# Patient Record
Sex: Male | Born: 1965 | Race: White | Hispanic: No | Marital: Married | Smoking: Former smoker
Health system: Southern US, Community
[De-identification: ages and names within clinical notes are randomized; demographics above are authoritative.]

---

## 2017-02-22 ENCOUNTER — Encounter (HOSPITAL_COMMUNITY): Payer: Self-pay

## 2017-02-22 ENCOUNTER — Emergency Department (HOSPITAL_COMMUNITY): Payer: BLUE CROSS/BLUE SHIELD

## 2017-02-22 ENCOUNTER — Inpatient Hospital Stay (HOSPITAL_COMMUNITY)
Admission: EM | Admit: 2017-02-22 | Discharge: 2017-02-27 | DRG: 084 | Disposition: A | Discharge status: Home / Self Care | Payer: BLUE CROSS/BLUE SHIELD | Attending: General Surgery | Admitting: General Surgery

## 2017-02-22 DIAGNOSIS — S066X9A Traumatic subarachnoid hemorrhage with loss of consciousness of unspecified duration, initial encounter: Principal | ICD-10-CM | POA: Diagnosis present

## 2017-02-22 DIAGNOSIS — G9389 Other specified disorders of brain: Secondary | ICD-10-CM | POA: Diagnosis present

## 2017-02-22 DIAGNOSIS — R4182 Altered mental status, unspecified: Secondary | ICD-10-CM | POA: Diagnosis not present

## 2017-02-22 DIAGNOSIS — S0181XA Laceration without foreign body of other part of head, initial encounter: Secondary | ICD-10-CM | POA: Diagnosis present

## 2017-02-22 DIAGNOSIS — S0219XA Other fracture of base of skull, initial encounter for closed fracture: Secondary | ICD-10-CM | POA: Diagnosis present

## 2017-02-22 DIAGNOSIS — S020XXA Fracture of vault of skull, initial encounter for closed fracture: Secondary | ICD-10-CM | POA: Diagnosis present

## 2017-02-22 DIAGNOSIS — I609 Nontraumatic subarachnoid hemorrhage, unspecified: Secondary | ICD-10-CM

## 2017-02-22 DIAGNOSIS — I10 Essential (primary) hypertension: Secondary | ICD-10-CM | POA: Diagnosis present

## 2017-02-22 DIAGNOSIS — S0101XA Laceration without foreign body of scalp, initial encounter: Secondary | ICD-10-CM | POA: Diagnosis present

## 2017-02-22 DIAGNOSIS — Z8572 Personal history of non-Hodgkin lymphomas: Secondary | ICD-10-CM

## 2017-02-22 DIAGNOSIS — T1490XA Injury, unspecified, initial encounter: Secondary | ICD-10-CM

## 2017-02-22 LAB — CDS SEROLOGY

## 2017-02-22 LAB — I-STAT CHEM 8, ED
BUN: 15 mg/dL (ref 6–20)
CALCIUM ION: 1 mmol/L — AB (ref 1.15–1.40)
Chloride: 103 mmol/L (ref 101–111)
Creatinine, Ser: 1.2 mg/dL (ref 0.61–1.24)
GLUCOSE: 99 mg/dL (ref 65–99)
HCT: 46 % (ref 39.0–52.0)
HEMOGLOBIN: 15.6 g/dL (ref 13.0–17.0)
Potassium: 3.6 mmol/L (ref 3.5–5.1)
SODIUM: 141 mmol/L (ref 135–145)
TCO2: 24 mmol/L (ref 22–32)

## 2017-02-22 LAB — COMPREHENSIVE METABOLIC PANEL
ALT: 56 U/L (ref 17–63)
ANION GAP: 12 (ref 5–15)
AST: 70 U/L — ABNORMAL HIGH (ref 15–41)
Albumin: 4 g/dL (ref 3.5–5.0)
Alkaline Phosphatase: 122 U/L (ref 38–126)
BUN: 14 mg/dL (ref 6–20)
CHLORIDE: 104 mmol/L (ref 101–111)
CO2: 21 mmol/L — ABNORMAL LOW (ref 22–32)
Calcium: 8.5 mg/dL — ABNORMAL LOW (ref 8.9–10.3)
Creatinine, Ser: 1.08 mg/dL (ref 0.61–1.24)
Glucose, Bld: 92 mg/dL (ref 65–99)
POTASSIUM: 3.9 mmol/L (ref 3.5–5.1)
SODIUM: 137 mmol/L (ref 135–145)
Total Bilirubin: 0.8 mg/dL (ref 0.3–1.2)
Total Protein: 6.9 g/dL (ref 6.5–8.1)

## 2017-02-22 LAB — CBC
HEMATOCRIT: 44.9 % (ref 39.0–52.0)
HEMOGLOBIN: 15.4 g/dL (ref 13.0–17.0)
MCH: 29.2 pg (ref 26.0–34.0)
MCHC: 34.3 g/dL (ref 30.0–36.0)
MCV: 85 fL (ref 78.0–100.0)
PLATELETS: 221 10*3/uL (ref 150–400)
RBC: 5.28 MIL/uL (ref 4.22–5.81)
RDW: 13.8 % (ref 11.5–15.5)
WBC: 6.8 10*3/uL (ref 4.0–10.5)

## 2017-02-22 LAB — URINALYSIS, ROUTINE W REFLEX MICROSCOPIC
BILIRUBIN URINE: NEGATIVE
Glucose, UA: NEGATIVE mg/dL
Ketones, ur: NEGATIVE mg/dL
LEUKOCYTES UA: NEGATIVE
NITRITE: NEGATIVE
Protein, ur: 100 mg/dL — AB
SPECIFIC GRAVITY, URINE: 1.028 (ref 1.005–1.030)
Squamous Epithelial / LPF: NONE SEEN
pH: 6 (ref 5.0–8.0)

## 2017-02-22 LAB — I-STAT CG4 LACTIC ACID, ED: LACTIC ACID, VENOUS: 2.97 mmol/L — AB (ref 0.5–1.9)

## 2017-02-22 LAB — I-STAT TROPONIN, ED: Troponin i, poc: 0.03 ng/mL (ref 0.00–0.08)

## 2017-02-22 LAB — PROTIME-INR
INR: 0.97
Prothrombin Time: 12.8 seconds (ref 11.4–15.2)

## 2017-02-22 LAB — ETHANOL: Alcohol, Ethyl (B): 60 mg/dL — ABNORMAL HIGH (ref <10)

## 2017-02-22 LAB — SAMPLE TO BLOOD BANK

## 2017-02-22 MED ORDER — MORPHINE SULFATE (PF) 4 MG/ML IV SOLN: 2.0000 mg | INTRAVENOUS | Status: DC | PRN

## 2017-02-22 MED ORDER — MORPHINE SULFATE (PF) 4 MG/ML IV SOLN
4.0000 mg | Freq: Once | INTRAVENOUS | Status: AC
  Administered 2017-02-22: 4 mg via INTRAVENOUS
  Filled 2017-02-22: qty 1

## 2017-02-22 MED ORDER — ONDANSETRON HCL 4 MG/2ML IJ SOLN
4.0000 mg | Freq: Four times a day (QID) | INTRAMUSCULAR | Status: DC | PRN
  Administered 2017-02-22 – 2017-02-27 (×8): 4 mg via INTRAVENOUS
  Filled 2017-02-22 (×8): qty 2

## 2017-02-22 MED ORDER — MORPHINE SULFATE (PF) 4 MG/ML IV SOLN
2.0000 mg | INTRAVENOUS | Status: DC | PRN
  Administered 2017-02-22 – 2017-02-24 (×5): 4 mg via INTRAVENOUS
  Filled 2017-02-22 (×5): qty 1

## 2017-02-22 MED ORDER — IOPAMIDOL (ISOVUE-370) INJECTION 76%
INTRAVENOUS | Status: AC
Start: 2017-02-22 — End: 2017-02-23
  Administered 2017-02-23: 50 mL
  Filled 2017-02-22: qty 50

## 2017-02-22 MED ORDER — BISACODYL 10 MG RE SUPP: 10.0000 mg | Freq: Every day | RECTAL | Status: DC | PRN

## 2017-02-22 MED ORDER — IOPAMIDOL (ISOVUE-300) INJECTION 61%
INTRAVENOUS | Status: AC
  Administered 2017-02-22: 100 mL
  Filled 2017-02-22: qty 100

## 2017-02-22 MED ORDER — TETANUS-DIPHTH-ACELL PERTUSSIS 5-2.5-18.5 LF-MCG/0.5 IM SUSP
0.5000 mL | Freq: Once | INTRAMUSCULAR | Status: AC
Start: 2017-02-22 — End: 2017-02-22
  Administered 2017-02-22: 0.5 mL via INTRAMUSCULAR
  Filled 2017-02-22: qty 0.5

## 2017-02-22 MED ORDER — PANTOPRAZOLE SODIUM 40 MG PO TBEC
40.0000 mg | DELAYED_RELEASE_TABLET | Freq: Every day | ORAL | Status: DC
  Administered 2017-02-23 – 2017-02-26 (×3): 40 mg via ORAL
  Filled 2017-02-22 (×3): qty 1

## 2017-02-22 MED ORDER — VANCOMYCIN HCL 10 G IV SOLR
2000.0000 mg | Freq: Once | INTRAVENOUS | Status: AC
  Administered 2017-02-23: 2000 mg via INTRAVENOUS
  Filled 2017-02-22: qty 2000

## 2017-02-22 MED ORDER — PANTOPRAZOLE SODIUM 40 MG IV SOLR
40.0000 mg | Freq: Every day | INTRAVENOUS | Status: DC
  Administered 2017-02-25 – 2017-02-27 (×2): 40 mg via INTRAVENOUS
  Filled 2017-02-22 (×2): qty 40

## 2017-02-22 MED ORDER — ACETAMINOPHEN 500 MG PO TABS
1000.0000 mg | ORAL_TABLET | Freq: Three times a day (TID) | ORAL | Status: DC
  Administered 2017-02-23 – 2017-02-27 (×14): 1000 mg via ORAL
  Filled 2017-02-22 (×14): qty 2

## 2017-02-22 MED ORDER — OXYCODONE HCL 5 MG PO TABS
5.0000 mg | ORAL_TABLET | ORAL | Status: DC | PRN
  Administered 2017-02-23 – 2017-02-24 (×3): 5 mg via ORAL
  Filled 2017-02-22 (×4): qty 1

## 2017-02-22 MED ORDER — LIDOCAINE HCL (PF) 1 % IJ SOLN
5.0000 mL | Freq: Once | INTRAMUSCULAR | Status: AC
  Administered 2017-02-22: 5 mL via INTRADERMAL
  Filled 2017-02-22: qty 5

## 2017-02-22 MED ORDER — SODIUM CHLORIDE 0.9 % IV SOLN
INTRAVENOUS | Status: DC
  Administered 2017-02-23: 1000 mL via INTRAVENOUS

## 2017-02-22 MED ORDER — PROCHLORPERAZINE MALEATE 10 MG PO TABS
10.0000 mg | ORAL_TABLET | Freq: Four times a day (QID) | ORAL | Status: DC | PRN
  Administered 2017-02-25: 10 mg via ORAL
  Filled 2017-02-22 (×2): qty 1

## 2017-02-22 MED ORDER — ONDANSETRON 4 MG PO TBDP
4.0000 mg | ORAL_TABLET | Freq: Four times a day (QID) | ORAL | Status: DC | PRN
  Administered 2017-02-23 – 2017-02-24 (×2): 4 mg via ORAL
  Filled 2017-02-22 (×2): qty 1

## 2017-02-22 MED ORDER — PROCHLORPERAZINE EDISYLATE 5 MG/ML IJ SOLN
5.0000 mg | Freq: Four times a day (QID) | INTRAMUSCULAR | Status: DC | PRN
  Administered 2017-02-23: 5 mg via INTRAVENOUS
  Administered 2017-02-24: 10 mg via INTRAVENOUS
  Filled 2017-02-22 (×2): qty 2

## 2017-02-22 MED ORDER — LACTATED RINGERS IV BOLUS (SEPSIS)
1000.0000 mL | Freq: Once | INTRAVENOUS | Status: AC
  Administered 2017-02-22: 1000 mL via INTRAVENOUS

## 2017-02-22 MED ORDER — FENTANYL CITRATE (PF) 100 MCG/2ML IJ SOLN
50.0000 ug | Freq: Once | INTRAMUSCULAR | Status: AC
Start: 2017-02-22 — End: 2017-02-22
  Administered 2017-02-22: 50 ug via INTRAVENOUS
  Filled 2017-02-22: qty 2

## 2017-02-22 MED ORDER — GENTAMICIN SULFATE 40 MG/ML IJ SOLN
160.0000 mg | Freq: Once | INTRAMUSCULAR | Status: AC
  Administered 2017-02-23: 160 mg via INTRAVENOUS
  Filled 2017-02-22: qty 4
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
## 2017-02-23 ENCOUNTER — Observation Stay (HOSPITAL_COMMUNITY): Payer: BLUE CROSS/BLUE SHIELD

## 2017-02-23 ENCOUNTER — Other Ambulatory Visit: Payer: Self-pay

## 2017-02-23 ENCOUNTER — Encounter (HOSPITAL_COMMUNITY): Payer: Self-pay

## 2017-02-23 DIAGNOSIS — S0219XA Other fracture of base of skull, initial encounter for closed fracture: Secondary | ICD-10-CM | POA: Diagnosis present

## 2017-02-23 DIAGNOSIS — R4182 Altered mental status, unspecified: Secondary | ICD-10-CM | POA: Diagnosis present

## 2017-02-23 DIAGNOSIS — Z8572 Personal history of non-Hodgkin lymphomas: Secondary | ICD-10-CM | POA: Diagnosis not present

## 2017-02-23 DIAGNOSIS — S0181XA Laceration without foreign body of other part of head, initial encounter: Secondary | ICD-10-CM | POA: Diagnosis present

## 2017-02-23 DIAGNOSIS — S020XXA Fracture of vault of skull, initial encounter for closed fracture: Secondary | ICD-10-CM | POA: Diagnosis present

## 2017-02-23 DIAGNOSIS — S066X9A Traumatic subarachnoid hemorrhage with loss of consciousness of unspecified duration, initial encounter: Secondary | ICD-10-CM | POA: Diagnosis present

## 2017-02-23 DIAGNOSIS — I10 Essential (primary) hypertension: Secondary | ICD-10-CM | POA: Diagnosis present

## 2017-02-23 DIAGNOSIS — G9389 Other specified disorders of brain: Secondary | ICD-10-CM | POA: Diagnosis present

## 2017-02-23 DIAGNOSIS — S0101XA Laceration without foreign body of scalp, initial encounter: Secondary | ICD-10-CM | POA: Diagnosis present

## 2017-02-23 LAB — BASIC METABOLIC PANEL
Anion gap: 12 (ref 5–15)
BUN: 12 mg/dL (ref 6–20)
CHLORIDE: 102 mmol/L (ref 101–111)
CO2: 21 mmol/L — ABNORMAL LOW (ref 22–32)
Calcium: 8.6 mg/dL — ABNORMAL LOW (ref 8.9–10.3)
Creatinine, Ser: 1 mg/dL (ref 0.61–1.24)
GFR calc non Af Amer: >60 mL/min (ref >60)
Glucose, Bld: 130 mg/dL — ABNORMAL HIGH (ref 65–99)
POTASSIUM: 4.6 mmol/L (ref 3.5–5.1)
SODIUM: 135 mmol/L (ref 135–145)

## 2017-02-23 LAB — CBC
HEMATOCRIT: 46.6 % (ref 39.0–52.0)
Hemoglobin: 15.9 g/dL (ref 13.0–17.0)
MCH: 29 pg (ref 26.0–34.0)
MCHC: 34.1 g/dL (ref 30.0–36.0)
MCV: 85 fL (ref 78.0–100.0)
Platelets: 188 10*3/uL (ref 150–400)
RBC: 5.48 MIL/uL (ref 4.22–5.81)
RDW: 13.9 % (ref 11.5–15.5)
WBC: 13.3 10*3/uL — AB (ref 4.0–10.5)

## 2017-02-23 LAB — HIV ANTIBODY (ROUTINE TESTING W REFLEX): HIV Screen 4th Generation wRfx: NONREACTIVE

## 2017-02-23 MED ORDER — ASPIRIN EC 81 MG PO TBEC
81.0000 mg | DELAYED_RELEASE_TABLET | Freq: Every day | ORAL | Status: DC
  Administered 2017-02-23 – 2017-02-27 (×5): 81 mg via ORAL
  Filled 2017-02-23 (×5): qty 1

## 2017-02-23 MED ORDER — LISINOPRIL 10 MG PO TABS
10.0000 mg | ORAL_TABLET | Freq: Every day | ORAL | Status: DC
  Administered 2017-02-23 – 2017-02-27 (×5): 10 mg via ORAL
  Filled 2017-02-23 (×5): qty 1
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
## 2017-02-23 NOTE — ED Notes (Signed)
Attempted to call report x 1  
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
## 2017-02-23 NOTE — ED Notes (Signed)
Patient transported to CT 

## 2017-02-23 NOTE — Progress Notes (Signed)
Patient ID: Elijah ChimeraCharles Baird, male   DOB: 1965/10/17, 51 y.o.   MRN: [id number] Patient awake alert complaining of a headache and some nausea.  Oriented 3 pupils are equal extraocular movements are intact strength 5 out of 5 upper and lower extremities  Follow-up CT as had not been done yet this morning but CT image her last night did show grade 1 blunt compression to ICA clinoid. We'll start enteric-coated aspirin. Continue mobilizes per trauma continue c-collar for now

## 2017-02-23 NOTE — ED Notes (Signed)
Neurosurgery rounding at bedside 
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
## 2017-02-24 ENCOUNTER — Encounter (HOSPITAL_COMMUNITY): Payer: Self-pay

## 2017-02-24 LAB — BASIC METABOLIC PANEL
Anion gap: 9 (ref 5–15)
BUN: 14 mg/dL (ref 6–20)
CHLORIDE: 102 mmol/L (ref 101–111)
CO2: 26 mmol/L (ref 22–32)
CREATININE: 0.98 mg/dL (ref 0.61–1.24)
Calcium: 8.9 mg/dL (ref 8.9–10.3)
GFR calc non Af Amer: >60 mL/min (ref >60)
Glucose, Bld: 118 mg/dL — ABNORMAL HIGH (ref 65–99)
Potassium: 3.7 mmol/L (ref 3.5–5.1)
Sodium: 137 mmol/L (ref 135–145)

## 2017-02-24 LAB — CBC
HEMATOCRIT: 41.6 % (ref 39.0–52.0)
HEMOGLOBIN: 14.4 g/dL (ref 13.0–17.0)
MCH: 29.3 pg (ref 26.0–34.0)
MCHC: 34.6 g/dL (ref 30.0–36.0)
MCV: 84.7 fL (ref 78.0–100.0)
Platelets: 192 10*3/uL (ref 150–400)
RBC: 4.91 MIL/uL (ref 4.22–5.81)
RDW: 14.1 % (ref 11.5–15.5)
WBC: 8.8 10*3/uL (ref 4.0–10.5)

## 2017-02-24 MED ORDER — INFLUENZA VAC SPLIT QUAD 0.5 ML IM SUSY: 0.5000 mL | PREFILLED_SYRINGE | INTRAMUSCULAR | Status: DC

## 2017-02-24 MED ORDER — OXYCODONE HCL 5 MG PO TABS
5.0000 mg | ORAL_TABLET | ORAL | Status: DC | PRN
  Administered 2017-02-25 – 2017-02-26 (×3): 10 mg via ORAL
  Administered 2017-02-27: 5 mg via ORAL
  Filled 2017-02-24 (×2): qty 2
  Filled 2017-02-24: qty 1
  Filled 2017-02-24 (×2): qty 2

## 2017-02-24 MED ORDER — MORPHINE SULFATE (PF) 4 MG/ML IV SOLN
2.0000 mg | INTRAVENOUS | Status: DC | PRN
  Administered 2017-02-24 – 2017-02-25 (×5): 4 mg via INTRAVENOUS
  Administered 2017-02-25: 2 mg via INTRAVENOUS
  Administered 2017-02-25 (×2): 4 mg via INTRAVENOUS
  Administered 2017-02-26 (×2): 2 mg via INTRAVENOUS
  Filled 2017-02-24 (×9): qty 1

## 2017-02-24 NOTE — Progress Notes (Signed)
Patient ID: Elijah ChimeraCharles Guynes, male   DOB: 1965/05/30, 51 y.o.   MRN: [id number] Patient well minimal headache  Awake alert oriented strength 5 out of 5 neurologically nonfocal  Mobilize per trauma discharge when medically stable per trauma no new neurosurgical recommendations

## 2017-02-24 NOTE — Progress Notes (Signed)
2000: Handoff report received from RN. Pt resting comfortably, describing pain as "manageable." Plan of care for the night discussed. Pt amenable to plan.  0000: Pt continues resting comfortably.  0400: Pt continues resting comfortably.  0700: Handoff reports given to RN. No acute events overnight.
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
## 2017-02-25 NOTE — Progress Notes (Signed)
Spoke to patient about possibly delaying his discharge another day so that we can monitor his mobility and the dizziness that comes with ambulation. Patient agreed, says he does not think that he can tolerate the 30 minute car ride home as of now.
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
## 2017-02-26 MED ORDER — TRAMADOL HCL 50 MG PO TABS
50.0000 mg | ORAL_TABLET | Freq: Four times a day (QID) | ORAL | Status: DC
  Administered 2017-02-26 – 2017-02-27 (×5): 50 mg via ORAL
  Filled 2017-02-26 (×5): qty 1

## 2017-02-26 MED ORDER — MORPHINE SULFATE (PF) 4 MG/ML IV SOLN: 2.0000 mg | INTRAVENOUS | Status: DC | PRN

## 2017-02-26 MED ORDER — ENOXAPARIN SODIUM 40 MG/0.4ML ~~LOC~~ SOLN
40.0000 mg | SUBCUTANEOUS | Status: DC
  Administered 2017-02-26: 40 mg via SUBCUTANEOUS
  Filled 2017-02-26: qty 0.4
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
## 2017-02-27 MED ORDER — TRAMADOL HCL 50 MG PO TABS: 50.0000 mg | ORAL_TABLET | Freq: Four times a day (QID) | ORAL | 0 refills | Status: AC

## 2017-02-27 MED ORDER — ACETAMINOPHEN 500 MG PO TABS: 1000.0000 mg | ORAL_TABLET | Freq: Three times a day (TID) | ORAL | 0 refills | Status: AC

## 2017-02-27 MED ORDER — LISINOPRIL 10 MG PO TABS: 10.0000 mg | ORAL_TABLET | Freq: Every day | ORAL | 1 refills | Status: AC

## 2017-02-27 MED ORDER — ASPIRIN 81 MG PO TBEC: 81.0000 mg | DELAYED_RELEASE_TABLET | Freq: Every day | ORAL | 1 refills | Status: AC

## 2017-02-27 MED ORDER — PROMETHAZINE HCL 25 MG PO TABS: 25.0000 mg | ORAL_TABLET | Freq: Four times a day (QID) | ORAL | 0 refills | Status: AC | PRN

## 2017-02-27 MED ORDER — OXYCODONE HCL 5 MG PO TABS: 5.0000 mg | ORAL_TABLET | ORAL | 0 refills | Status: AC | PRN

## 2017-02-27 MED ORDER — PROMETHAZINE HCL 25 MG PO TABS: 25.0000 mg | ORAL_TABLET | Freq: Four times a day (QID) | ORAL | Status: DC | PRN

## 2017-02-27 NOTE — Discharge Instructions (Signed)
Traumatic Brain Injury °Traumatic brain injury (TBI) is an injury to your brain from a blow to your head (closed injury) or an object penetrating your skull and entering your brain (open injury). The severity of TBI varies significantly from one person to the next. Some TBIs cause you to pass out (lose consciousness) immediately and for a long period of time. Other TBIs do not cause any loss of consciousness. °Symptoms of any type of TBI can be long lasting (chronic). TBI can interfere with memory and speech. TBI can also cause chronic symptoms like headache or dizziness. °What are the causes? °TBI is caused by a closed or open injury. °What increases the risk? °You may be at higher risk for TBI if you: °· Are 75 or older. °· Are a man. °· Are in a car accident. °· Play a contact sport, especially football, hockey, or soccer. °· Do not wear protective gear while playing sports. °· Are in the military. °· Are a victim of violence. °· Abuse drugs or alcohol. °· Have had a previous TBI. ° °What are the signs or symptoms? °Signs and symptoms of TBI may occur right away or not until days, weeks, or months after the injury. They may last for days, weeks, months, or years. Symptoms may include: °· Loss of consciousness. °· Headache. °· Confusion. °· Fatigue. °· Changes in sleep. °· Dizziness. °· Mood or personality changes. °· Memory problems. °· Nausea or vomiting or both. °· Seizures. °· Clumsiness. °· Slurred speech. °· Depression and anxiety. °· Anger. °· Inability to control emotions or actions (impulse control). °· Loss of or dulling of your senses, such as hearing, vision, and touch. This can include: °? Blurred vision. °? Ringing in your ears. ° °How is this diagnosed? °TBI may be diagnosed by a medical history and physical exam. Your health care provider will also do a neurologic exam to check your: °· Reflexes. °· Sensations. °· Alertness. °· Memory. °· Vision. °· Hearing. °· Coordination. ° °Your health care  provider will also do tests to diagnose the extent of your TBI, such as a CT scan of your brain and skull. One way to determine the severity of your TBI is with a scoring system called the Glasgow Coma Scale (GCS). It measures eye opening, motor response, and verbal response. The higher the score, the milder the TBI. °Your TBI may be described as mild, moderate, or severe: °· Mild TBI (concussion). °? Symptoms of mild TBI usually go away on their own. This can take weeks or months, depending on the type of concussion. °? Your GCS will be 13-15. °? Your brain CT scan will be normal. °? You may or may not have a short hospital stay. °· Moderate TBI. °? Your GCS will be 9-12. °? Your brain CT scan will be abnormal. °? You will likely need a short hospital stay. °· Severe TBI. °? Your GCS will be 3-8. °? Your brain CT scan will be abnormal. °? You may need a long stay in the hospital. ° °How is this treated? °Emergency treatment of TBI may involve measures to maintain a clear airway and stable blood pressure. Brain surgery may be needed to: °· Remove a blood clot. °· Repair bleeding. °· Remove an object that has penetrated the brain, such as a skull fragment or a bullet. ° °Other treatments depend on your chronic signs and symptoms. These treatments include the following types of therapy: °· Physical. °· Occupational. °· Speech and language. °· Mental health. °·   Social support. ° °Follow these instructions at home: °· Carefully follow all your health care provider's instructions. °· Work closely with all your therapists, if necessary. °· Take medicines only as directed by your health care provider. Do not take aspirin or other anti-inflammatory medicines such as ibuprofen or naproxen unless approved by your health care provider. °· Do not abuse illegal drugs. °· Limit alcohol intake to no more than 1 drink per day for nonpregnant women and 2 drinks per day for men. One drink equals 12 ounces of beer, 5 ounces of wine,  or 1½ ounces of hard liquor. °· Avoid any situation where there is potential for another head injury, such as football, hockey, soccer, basketball, martial arts, downhill snow sports, and horseback riding. Do not do these activities until your health care provider approves. °· Rest. Rest helps the brain to heal. Make sure you: °? Get plenty of sleep at night. Avoid staying up late at night. °? Keep the same bedtime hours on weekends and weekdays. °? Rest during the day. Take daytime naps or rest breaks when you feel tired. °· Avoid excessive visual stimulation while recovering from a TBI. This includes work on the computer, watching TV, and reading. °· Try to avoid activities that cause physical or mental stress. Stay home from work or school as directed by your health care provider. °· Make lists to plan your day and help your memory. °· Do not drive, ride a bicycle, or operate heavy machinery until your health care provider approves. °· Seek support from friends and family. °· Keep all follow-up visits as directed by your health care provider. This is important. °· Watch your symptoms and tell others to do the same. Complications sometimes occur after a TBI. °How is this prevented? °· Wear a helmet while biking, skiing, skateboarding, skating, or doing similar activities. Wear your seatbelt while driving. °· Do not abuse alcohol or drugs. °· Do not drink and drive. °· Prevent falls at home by: °? Removing clutter and tripping hazards, including loose rugs, from floors and stairways. °? Using grab bars in bathrooms and handrails by stairs. °? Placing nonslip mats on floors and in bathtubs. °? Improving lighting in dim areas. °Contact a health care provider if: °Seek medical care if you have any of the following symptoms for more than two weeks after your injury: °· Chronic headaches. °· Dizziness or balance problems. °· Nausea. °· Vision problems. °· Increased sensitivity to noise or light. °· Depression or mood  swings. °· Anxiety or irritability. °· Memory problems. °· Difficulty concentrating or paying attention. °· Sleep problems. °· Feeling tired all the time. ° °Get help right away if: °· You have confusion or unusual drowsiness. °· It is difficult to wake you up. °· You have nausea or persistent, forceful vomiting. °· You feel like you are moving when you are not (vertigo). Your eyes may move rapidly back and forth. °· You have convulsions or faint. °· You have severe, persistent headaches that are not relieved by medicine. °· You cannot use your arms or legs normally. °· One of your pupils is larger than the other. °· You have clear or bloody discharge from your nose or ears. °· Your problems are getting worse, not better. °This information is not intended to replace advice given to you by your health care provider. Make sure you discuss any questions you have with your health care provider. °Document Released: 02/23/2002 Document Revised: 11/06/2015 Document Reviewed: 06/18/2013 °Elsevier Interactive Patient Education ©   2017 [employer] ° °
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
